# Patient Record
Sex: Male | Born: 1990 | Race: White | Hispanic: No | Marital: Married | Smoking: Never smoker
Health system: Southern US, Community
[De-identification: ages and names within clinical notes are randomized; demographics above are authoritative.]

## PROBLEM LIST (undated history)

## (undated) DIAGNOSIS — J45909 Unspecified asthma, uncomplicated: Secondary | ICD-10-CM

## (undated) HISTORY — PX: WISDOM TOOTH EXTRACTION: SHX21

## (undated) HISTORY — DX: Unspecified asthma, uncomplicated: J45.909

---
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
## 2017-05-19 ENCOUNTER — Encounter (HOSPITAL_COMMUNITY): Payer: Self-pay | Admitting: Cardiology

## 2017-05-19 ENCOUNTER — Emergency Department (HOSPITAL_COMMUNITY)
Admission: EM | Admit: 2017-05-19 | Discharge: 2017-05-19 | Disposition: A | Discharge status: Home / Self Care | Payer: 59 | Attending: Emergency Medicine | Admitting: Emergency Medicine

## 2017-05-19 DIAGNOSIS — L237 Allergic contact dermatitis due to plants, except food: Secondary | ICD-10-CM | POA: Insufficient documentation

## 2017-05-19 DIAGNOSIS — R21 Rash and other nonspecific skin eruption: Secondary | ICD-10-CM | POA: Diagnosis present

## 2017-05-19 MED ORDER — DEXAMETHASONE SODIUM PHOSPHATE 10 MG/ML IJ SOLN
10.0000 mg | Freq: Once | INTRAMUSCULAR | Status: AC
  Administered 2017-05-19: 10 mg via INTRAMUSCULAR
  Filled 2017-05-19: qty 1

## 2017-05-19 MED ORDER — PREDNISONE 20 MG PO TABS: 40.0000 mg | ORAL_TABLET | Freq: Every day | ORAL | 0 refills | Status: DC

## 2017-05-19 NOTE — Discharge Instructions (Signed)
Please wash hands frequently.  Please use a glove to prevent spread of the infection to your own body as well as to others.  Please use prednisone 2 tablets daily with a meal until all taken.

## 2017-05-19 NOTE — ED Triage Notes (Signed)
Rash to right hand left upper arm for 2 days.
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
## 2020-12-10 ENCOUNTER — Other Ambulatory Visit: Payer: Self-pay

## 2020-12-10 ENCOUNTER — Emergency Department (HOSPITAL_BASED_OUTPATIENT_CLINIC_OR_DEPARTMENT_OTHER)
Admission: EM | Admit: 2020-12-10 | Discharge: 2020-12-11 | Disposition: A | Discharge status: Home / Self Care | Payer: Worker's Compensation | Attending: Emergency Medicine | Admitting: Emergency Medicine

## 2020-12-10 ENCOUNTER — Encounter (HOSPITAL_BASED_OUTPATIENT_CLINIC_OR_DEPARTMENT_OTHER): Payer: Self-pay | Admitting: *Deleted

## 2020-12-10 DIAGNOSIS — R238 Other skin changes: Secondary | ICD-10-CM

## 2020-12-10 DIAGNOSIS — Z77098 Contact with and (suspected) exposure to other hazardous, chiefly nonmedicinal, chemicals: Secondary | ICD-10-CM

## 2020-12-10 DIAGNOSIS — R21 Rash and other nonspecific skin eruption: Secondary | ICD-10-CM | POA: Insufficient documentation

## 2020-12-10 DIAGNOSIS — T603X1A Toxic effect of herbicides and fungicides, accidental (unintentional), initial encounter: Secondary | ICD-10-CM | POA: Insufficient documentation

## 2020-12-10 MED ORDER — LORATADINE 10 MG PO TABS
10.0000 mg | ORAL_TABLET | Freq: Once | ORAL | Status: AC
  Administered 2020-12-11: 10 mg via ORAL
  Filled 2020-12-10: qty 1

## 2020-12-10 MED ORDER — CETIRIZINE HCL 10 MG PO TABS: 10.0000 mg | ORAL_TABLET | Freq: Every day | ORAL | 0 refills | Status: DC
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
## 2022-02-23 ENCOUNTER — Ambulatory Visit
Admission: RE | Admit: 2022-02-23 | Discharge: 2022-02-23 | Disposition: A | Discharge status: Home / Self Care | Payer: 59 | Source: Ambulatory Visit | Attending: Family Medicine | Admitting: Family Medicine

## 2022-02-23 VITALS — BP 134/85 | HR 66 | Temp 98.2°F | Resp 18

## 2022-02-23 DIAGNOSIS — B084 Enteroviral vesicular stomatitis with exanthem: Secondary | ICD-10-CM

## 2022-02-23 NOTE — ED Triage Notes (Signed)
Rash on hands and bumps on throat that started this morning.  States he feel foggy headed.  Daughter has hand foot and mouth.

## 2022-02-23 NOTE — ED Provider Notes (Signed)
  [facility] CARE [facility]ENTER   [id number] 02/23/22 Arrival Time: 0850  ASSESSMENT & PLAN:  1. Hand, foot and mouth disease    OTC symptom care. Work note provided.   Follow-up Information     [facility] at [facility].   Specialty: Urgent Care Why: As needed. Contact information: [address] [place] [address] [phone]               Will follow up with PCP or here if worsening or failing to improve as anticipated. Reviewed expectations re: course of current medical issues. Questions answered. Outlined signs and symptoms indicating need for more acute intervention. Patient verbalized understanding. After Visit Summary given.   SUBJECTIVE:  Samuel Kramer is a 31 y.o. male who presents with a skin complaint. Child with hand/foot/mouth. He awoke 48 h ago with fever and body aches. Now with rash on hand and feet; with areas in mouth also. OTC with mild relief. Feels fatigued.  OBJECTIVE: Vitals:   02/23/22 0856  BP: 134/85  Pulse: 66  Resp: 18  Temp: 98.2 F (36.8 C)  TempSrc: Oral  SpO2: 98%    General appearance: alert; no distress HEENT: [place]; AT; small blisters in mouth Neck: supple with FROM Lungs: clear to auscultation bilaterally Heart: regular rate and rhythm Extremities: no edema; moves all extremities normally Skin: warm and dry; signs of infection: no; oval flat pink macules on hands Psychological: alert and cooperative; normal mood and affect  Allergies  Allergen Reactions   Amoxicillin Anaphylaxis   Codeine Anaphylaxis    History reviewed. No pertinent past medical history. Social History   Socioeconomic History   Marital status: Single    Spouse name: Not on file   Number of children: Not on file   Years of education: Not on file   Highest education level: Not on file  Occupational History   Not on file  Tobacco Use   Smoking status: Never   Smokeless tobacco: Never  Substance and  Sexual Activity   Alcohol use: Yes    Comment: occasional    Drug use: No   Sexual activity: Not on file  Other Topics Concern   Not on file  Social History Narrative   Not on file   Social Determinants of Health   Financial Resource Strain: Not on file  Food Insecurity: Not on file  Transportation Needs: Not on file  Physical Activity: Not on file  Stress: Not on file  Social Connections: Not on file  Intimate Partner Violence: Not on file   History reviewed. No pertinent family history. Past Surgical History:  Procedure Laterality Date   WISDOM TOOTH EXTRACTION        Mardella Layman, MD 02/23/22 [id number]

## 2023-11-09 ENCOUNTER — Encounter: Payer: Self-pay | Admitting: Emergency Medicine

## 2023-11-09 ENCOUNTER — Ambulatory Visit
Admission: EM | Admit: 2023-11-09 | Discharge: 2023-11-09 | Disposition: A | Discharge status: Home / Self Care | Attending: Nurse Practitioner | Admitting: Nurse Practitioner

## 2023-11-09 DIAGNOSIS — B9689 Other specified bacterial agents as the cause of diseases classified elsewhere: Secondary | ICD-10-CM

## 2023-11-09 DIAGNOSIS — J019 Acute sinusitis, unspecified: Secondary | ICD-10-CM

## 2023-11-09 MED ORDER — BENZONATATE 100 MG PO CAPS: 100.0000 mg | ORAL_CAPSULE | Freq: Three times a day (TID) | ORAL | 0 refills | Status: DC | PRN

## 2023-11-09 MED ORDER — PROMETHAZINE-DM 6.25-15 MG/5ML PO SYRP: 5.0000 mL | ORAL_SOLUTION | Freq: Every evening | ORAL | 0 refills | Status: DC | PRN

## 2023-11-09 MED ORDER — DOXYCYCLINE HYCLATE 100 MG PO CAPS: 100.0000 mg | ORAL_CAPSULE | Freq: Two times a day (BID) | ORAL | 0 refills | Status: AC

## 2023-11-09 NOTE — ED Provider Notes (Addendum)
 [facility]    CSN: [id number] Arrival date & time: 11/09/23  0845      History   Chief Complaint No chief complaint on file.   HPI Samuel Kramer is a 33 y.o. male.   Patient presents today for 2 week history of congested cough, sinus headache, sinus pressure, chest pain with cough, sore throat from coughing, decreased appetite, and fatigue.  He reports send symptoms first began, he also had fever, body aches, and chills but this has not resolved.  He denies abdominal pain, nausea/vomiting, diarrhea.  He does have a little bit of ear pressure bilaterally.  Has taken Mucinex, DayQuil, NyQuil, and takes Zyrtec  every day for allergies which have not helped fully resolve symptoms.  No known sick contacts.    History reviewed. No pertinent past medical history.  There are no active problems to display for this patient.   Past Surgical History:  Procedure Laterality Date   WISDOM TOOTH EXTRACTION         Home Medications    Prior to Admission medications   Medication Sig Start Date End Date Taking? Authorizing Provider  benzonatate  (TESSALON ) 100 MG capsule Take 1 capsule (100 mg total) by mouth 3 (three) times daily as needed for cough. Do not take with alcohol or while operating or driving heavy machinery 12/10/93  Yes Wilhemena Harbour, NP  doxycycline  (VIBRAMYCIN ) 100 MG capsule Take 1 capsule (100 mg total) by mouth 2 (two) times daily for 7 days. 11/09/23 11/16/23 Yes Wilhemena Harbour, NP  promethazine -dextromethorphan (PROMETHAZINE -DM) 6.25-15 MG/5ML syrup Take 5 mLs by mouth at bedtime as needed for cough. Do not take with alcohol or while driving or operating heavy machinery.  May cause drowsiness. 11/09/23  Yes Wilhemena Harbour, NP    Family History History reviewed. No pertinent family history.  Social History Social History   Tobacco Use   Smoking status: Never   Smokeless tobacco: Never  Substance Use Topics   Alcohol use: Yes    Comment:  occasional    Drug use: No     Allergies   Amoxicillin and Codeine   Review of Systems Review of Systems Per HPI  Physical Exam Triage Vital Signs ED Triage Vitals  Encounter Vitals Group     BP 11/09/23 0921 127/87     Systolic BP Percentile --      Diastolic BP Percentile --      Pulse Rate 11/09/23 0921 76     Resp 11/09/23 0921 18     Temp 11/09/23 0921 98.4 F (36.9 C)     Temp Source 11/09/23 0921 Oral     SpO2 11/09/23 0921 96 %     Weight --      Height --      Head Circumference --      Peak Flow --      Pain Score 11/09/23 0922 6     Pain Loc --      Pain Education --      Exclude from Growth Chart --    No data found.  Updated Vital Signs BP 127/87 (BP Location: Right Arm)   Pulse 76   Temp 98.4 F (36.9 C) (Oral)   Resp 18   SpO2 96%   Visual Acuity Right Eye Distance:   Left Eye Distance:   Bilateral Distance:    Right Eye Near:   Left Eye Near:    Bilateral Near:     Physical Exam Vitals and  nursing note reviewed.  Constitutional:      General: He is not in acute distress.    Appearance: Normal appearance. He is not ill-appearing or toxic-appearing.  HENT:     Head: Normocephalic and atraumatic.     Right Ear: Tympanic membrane, ear canal and external ear normal.     Left Ear: Tympanic membrane, ear canal and external ear normal.     Nose: Congestion present. No rhinorrhea.     Right Sinus: Maxillary sinus tenderness and frontal sinus tenderness present.     Left Sinus: Maxillary sinus tenderness and frontal sinus tenderness present.     Mouth/Throat:     Mouth: Mucous membranes are moist.     Pharynx: Oropharynx is clear. No oropharyngeal exudate or posterior oropharyngeal erythema.  Eyes:     General: No scleral icterus.    Extraocular Movements: Extraocular movements intact.  Cardiovascular:     Rate and Rhythm: Normal rate and regular rhythm.  Pulmonary:     Effort: Pulmonary effort is normal. No respiratory distress.      Breath sounds: Normal breath sounds. No wheezing, rhonchi or rales.  Musculoskeletal:     Cervical back: Normal range of motion and neck supple.  Lymphadenopathy:     Cervical: No cervical adenopathy.  Skin:    General: Skin is warm and dry.     Coloration: Skin is not jaundiced or pale.     Findings: No erythema or rash.  Neurological:     Mental Status: He is alert and oriented to person, place, and time.  Psychiatric:        Behavior: Behavior is cooperative.      UC Treatments / Results  Labs (all labs ordered are listed, but only abnormal results are displayed) Labs Reviewed - No data to display  EKG   Radiology No results found.  Procedures Procedures (including critical care time)  Medications Ordered in UC Medications - No data to display  Initial Impression / Assessment and Plan / UC Course  I have reviewed the triage vital signs and the nursing notes.  Pertinent labs & imaging results that were available during my care of the patient were reviewed by me and considered in my medical decision making (see chart for details).   Patient is well-appearing, normotensive, afebrile, not tachycardic, not tachypneic, oxygenating well on room air.    1. Acute bacterial sinusitis Treat with doxycycline  twice daily for 7 days Supportive care discussed with patient including continue guaifenesin, increase hydration, cough suppressant medication as needed Return and ER precautions discussed  The patient was given the opportunity to ask questions.  All questions answered to their satisfaction.  The patient is in agreement to this plan.   Final Clinical Impressions(s) / UC Diagnoses   Final diagnoses:  Acute bacterial sinusitis     Discharge Instructions      You have a sinus infection.  Take the doxycycline  as prescribed to treat it.  Symptoms should improve over the next few days.  If you develop chest pain or shortness of breath, go to the emergency room.  Some  things that can make you feel better are: - Increased rest - Increasing fluid with water/sugar free electrolytes - Acetaminophen and ibuprofen as needed for fever/pain - Salt water gargling, chloraseptic spray and throat lozenges - OTC guaifenesin (Mucinex) 600 mg twice daily for congestion - Saline sinus flushes or a neti pot - Humidifying the air -Tessalon  Perles every 8 hours as needed for dry cough  and cough syrup at night time as needed     ED Prescriptions     Medication Sig Dispense Auth. Provider   doxycycline  (VIBRAMYCIN ) 100 MG capsule Take 1 capsule (100 mg total) by mouth 2 (two) times daily for 7 days. 14 capsule Thena Fireman A, NP   benzonatate  (TESSALON ) 100 MG capsule Take 1 capsule (100 mg total) by mouth 3 (three) times daily as needed for cough. Do not take with alcohol or while operating or driving heavy machinery 21 capsule Thena Fireman A, NP   promethazine -dextromethorphan (PROMETHAZINE -DM) 6.25-15 MG/5ML syrup Take 5 mLs by mouth at bedtime as needed for cough. Do not take with alcohol or while driving or operating heavy machinery.  May cause drowsiness. 118 mL Wilhemena Harbour, NP      PDMP not reviewed this encounter.   Wilhemena Harbour, NP 11/09/23 1033    Wilhemena Harbour, NP 11/09/23 [phone]

## 2023-11-09 NOTE — ED Triage Notes (Signed)
 Nasal congestion x 2 weeks.  Has been taking zyrtec , mucinex and dayquila nd nyquil without relief.  States he has a productive cough since yesterday with pressure in chest.

## 2023-11-09 NOTE — Discharge Instructions (Signed)
 You have a sinus infection.  Take the doxycycline  as prescribed to treat it.  Symptoms should improve over the next few days.  If you develop chest pain or shortness of breath, go to the emergency room.  Some things that can make you feel better are: - Increased rest - Increasing fluid with water/sugar free electrolytes - Acetaminophen and ibuprofen as needed for fever/pain - Salt water gargling, chloraseptic spray and throat lozenges - OTC guaifenesin (Mucinex) 600 mg twice daily for congestion - Saline sinus flushes or a neti pot - Humidifying the air -Tessalon  Perles every 8 hours as needed for dry cough and cough syrup at night time as needed

## 2024-03-12 ENCOUNTER — Ambulatory Visit: Admitting: Family Medicine

## 2024-04-02 ENCOUNTER — Ambulatory Visit: Admitting: Family Medicine

## 2024-04-02 ENCOUNTER — Encounter: Payer: Self-pay | Admitting: Family Medicine

## 2024-04-02 VITALS — BP 115/79 | HR 109 | Temp 97.9°F | Ht 69.0 in | Wt 165.8 lb

## 2024-04-02 DIAGNOSIS — R5383 Other fatigue: Secondary | ICD-10-CM

## 2024-04-02 DIAGNOSIS — Z0001 Encounter for general adult medical examination with abnormal findings: Secondary | ICD-10-CM | POA: Diagnosis not present

## 2024-04-02 DIAGNOSIS — Z8679 Personal history of other diseases of the circulatory system: Secondary | ICD-10-CM

## 2024-04-02 DIAGNOSIS — Z0000 Encounter for general adult medical examination without abnormal findings: Secondary | ICD-10-CM | POA: Insufficient documentation

## 2024-04-02 DIAGNOSIS — J0110 Acute frontal sinusitis, unspecified: Secondary | ICD-10-CM

## 2024-04-02 DIAGNOSIS — R0683 Snoring: Secondary | ICD-10-CM | POA: Diagnosis not present

## 2024-04-02 NOTE — Assessment & Plan Note (Signed)

## 2024-04-02 NOTE — Progress Notes (Signed)
 New Patient Office Visit  Subjective    Patient ID: Samuel Kramer, male    DOB: [birth year]  Age: 33 y.o. MRN: [id number]  CC:  Chief Complaint  Patient presents with   Establish Care    Chronic knee problems.   Presenting today w/ Sinus issues, dry cough w/ yellow/ brownish mucus production, runny nose, headache since Thursday of last week.     HPI Samuel Kramer presents to establish care. Oriented to practice routines and expectations. Has not been seeing a PCP in 4 years. PMH includes asthma, right knee meniscal tear (followed by Ortho).   Other concerns include mulopurulent sinus drainage, cough, malaise since Thursday Denies fever, chills, body aches, SOB, wheezing, pleurisy, N/V/D His children are also sick, no home flu/covid testing Is overall improving Has tried sinusex OTC  Fatigue: chronic, prior workup negative and included cardiology workup, TSH, testosterone that he can recall, insurance previously denied sleep study and he would like to pursue this, STOP BANG 3  Tobacco: non-smoker Vaccines: declined  Health Maintenance  Topic Date Due   Hepatitis C Screening  Never done   DTaP/Tdap/Td (1 - Tdap) Never done   Hepatitis B Vaccines 19-59 Average Risk (1 of 3 - 19+ 3-dose series) Never done   HPV VACCINES (1 - 3-dose SCDM series) Never done   COVID-19 Vaccine (1 - 2024-25 season) 04/18/2024 (Originally 03/11/2024)   Influenza Vaccine  04/24/2024 (Originally 02/09/2024)   HIV Screening  Completed   Pneumococcal Vaccine  Aged Out   Meningococcal B Vaccine  Aged Out     Outpatient Encounter Medications as of 04/02/2024  Medication Sig   Creatine Monohydrate (GNP CREATINE MONOHYDRATE) 1 g CHEW Chew 4 Pieces of gum by mouth daily.   Multiple Vitamin (MULTIVITAMIN WITH MINERALS) TABS tablet Take 2 tablets by mouth daily.   [DISCONTINUED] meloxicam (MOBIC) 15 MG tablet Take 15 mg by mouth daily.   [DISCONTINUED] benzonatate  (TESSALON ) 100 MG capsule Take 1 capsule  (100 mg total) by mouth 3 (three) times daily as needed for cough. Do not take with alcohol or while operating or driving heavy machinery   [DISCONTINUED] promethazine -dextromethorphan (PROMETHAZINE -DM) 6.25-15 MG/5ML syrup Take 5 mLs by mouth at bedtime as needed for cough. Do not take with alcohol or while driving or operating heavy machinery.  May cause drowsiness.   No facility-administered encounter medications on file as of 04/02/2024.    Past Medical History:  Diagnosis Date   Asthma     Past Surgical History:  Procedure Laterality Date   WISDOM TOOTH EXTRACTION      History reviewed. No pertinent family history.  Social History   Socioeconomic History   Marital status: Married    Spouse name: Not on file   Number of children: 2   Years of education: Not on file   Highest education level: Some college, no degree  Occupational History   Not on file  Tobacco Use   Smoking status: Never    Passive exposure: Never   Smokeless tobacco: Never  Vaping Use   Vaping status: Never Used  Substance and Sexual Activity   Alcohol use: Yes    Alcohol/week: 3.0 standard drinks of alcohol    Types: 2 Cans of beer, 1 Shots of liquor per week    Comment: occasional    Drug use: No   Sexual activity: Yes    Birth control/protection: None  Other Topics Concern   Not on file  Social History Narrative  Not on file   Social Drivers of Health   Financial Resource Strain: Low Risk  (04/01/2024)   Overall Financial Resource Strain (CARDIA)    Difficulty of Paying Living Expenses: Not hard at all  Food Insecurity: No Food Insecurity (04/01/2024)   Hunger Vital Sign    Worried About Running Out of Food in the Last Year: Never true    Ran Out of Food in the Last Year: Never true  Transportation Needs: No Transportation Needs (04/01/2024)   PRAPARE - Administrator, Civil Service (Medical): No    Lack of Transportation (Non-Medical): No  Physical Activity: Sufficiently  Active (04/01/2024)   Exercise Vital Sign    Days of Exercise per Week: 5 days    Minutes of Exercise per Session: 150+ min  Stress: Stress Concern Present (04/01/2024)   [employer] of Occupational Health - Occupational Stress Questionnaire    Feeling of Stress: To some extent  Social Connections: Moderately Isolated (04/01/2024)   Social Connection and Isolation Panel    Frequency of Communication with Friends and Family: More than three times a week    Frequency of Social Gatherings with Friends and Family: Twice a week    Attends Religious Services: Patient declined    Database administrator or Organizations: No    Attends Engineer, structural: Not on file    Marital Status: Married  Intimate Partner Violence: Unknown (10/14/2021)   Received from [facility]   HITS    Physically Hurt: Not on file    Insult or Talk Down To: Not on file    Threaten Physical Harm: Not on file    Scream or Curse: Not on file    Review of Systems  Constitutional:  Positive for malaise/fatigue.  HENT:  Positive for congestion and sinus pain.   Eyes: Negative.   Respiratory: Negative.    Cardiovascular: Negative.   Gastrointestinal: Negative.   Genitourinary: Negative.   Musculoskeletal: Negative.   Skin: Negative.   Neurological: Negative.   Endo/Heme/Allergies: Negative.   Psychiatric/Behavioral: Negative.    All other systems reviewed and are negative.       Objective    BP 115/79   Pulse (!) 109   Temp 97.9 F (36.6 C)   Ht 5' 9 (1.753 m)   Wt 165 lb 12.8 oz (75.2 kg)   SpO2 98%   BMI 24.48 kg/m   Physical Exam Vitals and nursing note reviewed.  Constitutional:      Appearance: Normal appearance. He is normal weight.  HENT:     Head: Normocephalic and atraumatic.     Right Ear: Tympanic membrane, ear canal and external ear normal.     Left Ear: Tympanic membrane, ear canal and external ear normal.     Nose: Congestion present.     Mouth/Throat:     Mouth:  Mucous membranes are moist.     Pharynx: Oropharynx is clear.  Eyes:     Extraocular Movements: Extraocular movements intact.     Right eye: Normal extraocular motion and no nystagmus.     Left eye: Normal extraocular motion and no nystagmus.     Conjunctiva/sclera: Conjunctivae normal.     Pupils: Pupils are equal, round, and reactive to light.  Cardiovascular:     Rate and Rhythm: Normal rate and regular rhythm.     Pulses: Normal pulses.     Heart sounds: Normal heart sounds.  Pulmonary:     Effort: Pulmonary effort is  normal.     Breath sounds: Normal breath sounds.  Abdominal:     General: Bowel sounds are normal.     Palpations: Abdomen is soft.  Genitourinary:    Comments: Deferred using shared decision making Musculoskeletal:        General: Normal range of motion.     Cervical back: Normal range of motion and neck supple.  Skin:    General: Skin is warm and dry.     Capillary Refill: Capillary refill takes less than 2 seconds.  Neurological:     General: No focal deficit present.     Mental Status: He is alert. Mental status is at baseline.  Psychiatric:        Mood and Affect: Mood normal.        Speech: Speech normal.        Behavior: Behavior normal.        Thought Content: Thought content normal.        Cognition and Memory: Cognition and memory normal.        Judgment: Judgment normal.         Assessment & Plan:   Problem List Items Addressed This Visit     Physical exam, annual - Primary   Today your medical history was reviewed and routine physical exam with labs was performed. Recommend 150 minutes of moderate intensity exercise weekly and consuming a well-balanced diet. Advised to stop smoking if a smoker, avoid smoking if a non-smoker, limit alcohol consumption to 1 drink per day for women and 2 drinks per day for men, and avoid illicit drug use. Counseled on safe sex practices and offered STI testing today. Counseled on the importance of sunscreen  use. Counseled in mental health awareness and when to seek medical care. Vaccine maintenance discussed. Appropriate health maintenance items reviewed. Return to office in 1 year for annual physical exam.       Relevant Orders   CBC with Differential/Platelet   Comprehensive metabolic panel with GFR   Lipid panel   Hepatitis C antibody   VITAMIN D  25 Hydroxy (Vit-D Deficiency, Fractures)   TSH   Snoring   Stop bang 3, referral to sleep studies      Relevant Orders   Ambulatory referral to Sleep Studies   Fatigue   Chronic. TSH, CBC, vitamin D  today. EKG NSR. Referral placed for sleep studies, stop bang 3.      Relevant Orders   CBC with Differential/Platelet   VITAMIN D  25 Hydroxy (Vit-D Deficiency, Fractures)   TSH   Ambulatory referral to Sleep Studies   EKG 12-Lead   History of irregular heartbeat   Normal EKG.      Relevant Orders   EKG 12-Lead   Acute non-recurrent frontal sinusitis   Symptoms overall improving, continue to monitor and will consider Augmentin if persists or worsens. Continue flonase, mucinex, saline flushes. Follow up PRN       Return in about 1 year (around 04/02/2025) for annual physical with labs 1 week prior.   Samuel GORMAN Barrio, FNP

## 2024-04-02 NOTE — Assessment & Plan Note (Signed)
 Stop bang 3, referral to sleep studies

## 2024-04-02 NOTE — Patient Instructions (Signed)
 It was great to meet you today and I'm excited to have you join the [facility] practice. I hope you had a positive experience today! If you feel so inclined, please feel free to recommend our practice to friends and family. Jeoffrey Barrio, FNP-C

## 2024-04-02 NOTE — Assessment & Plan Note (Signed)
Normal EKG

## 2024-04-02 NOTE — Assessment & Plan Note (Signed)
 Symptoms overall improving, continue to monitor and will consider Augmentin if persists or worsens. Continue flonase, mucinex, saline flushes. Follow up PRN

## 2024-04-02 NOTE — Assessment & Plan Note (Signed)
 Chronic. TSH, CBC, vitamin D  today. EKG NSR. Referral placed for sleep studies, stop bang 3.

## 2024-04-03 ENCOUNTER — Ambulatory Visit: Payer: Self-pay | Admitting: Family Medicine

## 2024-04-03 DIAGNOSIS — R7989 Other specified abnormal findings of blood chemistry: Secondary | ICD-10-CM

## 2024-04-03 DIAGNOSIS — E789 Disorder of lipoprotein metabolism, unspecified: Secondary | ICD-10-CM

## 2024-04-03 LAB — COMPREHENSIVE METABOLIC PANEL WITH GFR
AG Ratio: 1.6 (calc) (ref 1.0–2.5)
ALT: 34 U/L (ref 9–46)
AST: 20 U/L (ref 10–40)
Albumin: 4.5 g/dL (ref 3.6–5.1)
Alkaline phosphatase (APISO): 78 U/L (ref 36–130)
BUN: 11 mg/dL (ref 7–25)
CO2: 26 mmol/L (ref 20–32)
Calcium: 9.5 mg/dL (ref 8.6–10.3)
Chloride: 103 mmol/L (ref 98–110)
Creat: 1 mg/dL (ref 0.60–1.26)
Globulin: 2.8 g/dL (ref 1.9–3.7)
Glucose, Bld: 81 mg/dL (ref 65–99)
Potassium: 4.6 mmol/L (ref 3.5–5.3)
Sodium: 140 mmol/L (ref 135–146)
Total Bilirubin: 0.4 mg/dL (ref 0.2–1.2)
Total Protein: 7.3 g/dL (ref 6.1–8.1)
eGFR: 104 mL/min/1.73m2 (ref >=60)

## 2024-04-03 LAB — CBC WITH DIFFERENTIAL/PLATELET
Absolute Lymphocytes: 1705 {cells}/uL (ref 850–3900)
Absolute Monocytes: 563 {cells}/uL (ref 200–950)
Basophils Absolute: 59 {cells}/uL (ref 0–200)
Basophils Relative: 0.7 %
Eosinophils Absolute: 193 {cells}/uL (ref 15–500)
Eosinophils Relative: 2.3 %
HCT: 50.9 % — ABNORMAL HIGH (ref 38.5–50.0)
Hemoglobin: 16.1 g/dL (ref 13.2–17.1)
MCH: 26.4 pg — ABNORMAL LOW (ref 27.0–33.0)
MCHC: 31.6 g/dL — ABNORMAL LOW (ref 32.0–36.0)
MCV: 83.4 fL (ref 80.0–100.0)
MPV: 11.1 fL (ref 7.5–12.5)
Monocytes Relative: 6.7 %
Neutro Abs: 5880 {cells}/uL (ref 1500–7800)
Neutrophils Relative %: 70 %
Platelets: 268 Thousand/uL (ref 140–400)
RBC: 6.1 Million/uL — ABNORMAL HIGH (ref 4.20–5.80)
RDW: 14.5 % (ref 11.0–15.0)
Total Lymphocyte: 20.3 %
WBC: 8.4 Thousand/uL (ref 3.8–10.8)

## 2024-04-03 LAB — LIPID PANEL
Cholesterol: 195 mg/dL (ref <200)
HDL: 44 mg/dL (ref >=40)
LDL Cholesterol (Calc): 110 mg/dL — ABNORMAL HIGH
Non-HDL Cholesterol (Calc): 151 mg/dL — ABNORMAL HIGH (ref <130)
Total CHOL/HDL Ratio: 4.4 (calc) (ref <5.0)
Triglycerides: 311 mg/dL — ABNORMAL HIGH (ref <150)

## 2024-04-03 LAB — TSH: TSH: 0.95 m[IU]/L (ref 0.40–4.50)

## 2024-04-03 LAB — VITAMIN D 25 HYDROXY (VIT D DEFICIENCY, FRACTURES): Vit D, 25-Hydroxy: 27 ng/mL — ABNORMAL LOW (ref 30–100)

## 2024-04-03 LAB — HEPATITIS C ANTIBODY: Hepatitis C Ab: NONREACTIVE

## 2024-06-04 ENCOUNTER — Ambulatory Visit
Admission: EM | Admit: 2024-06-04 | Discharge: 2024-06-04 | Disposition: A | Discharge status: Home / Self Care | Attending: Family Medicine | Admitting: Family Medicine

## 2024-06-04 DIAGNOSIS — Z8709 Personal history of other diseases of the respiratory system: Secondary | ICD-10-CM | POA: Diagnosis not present

## 2024-06-04 DIAGNOSIS — J069 Acute upper respiratory infection, unspecified: Secondary | ICD-10-CM | POA: Diagnosis not present

## 2024-06-04 MED ORDER — ALBUTEROL SULFATE HFA 108 (90 BASE) MCG/ACT IN AERS: 2.0000 | INHALATION_SPRAY | RESPIRATORY_TRACT | 0 refills | Status: AC | PRN

## 2024-06-04 MED ORDER — AZELASTINE HCL 0.1 % NA SOLN: 1.0000 | Freq: Two times a day (BID) | NASAL | 0 refills | Status: AC

## 2024-06-04 MED ORDER — PROMETHAZINE-DM 6.25-15 MG/5ML PO SYRP: 5.0000 mL | ORAL_SOLUTION | Freq: Four times a day (QID) | ORAL | 0 refills | Status: AC | PRN

## 2024-06-04 NOTE — ED Provider Notes (Signed)
 [facility]    CSN: [id number] Arrival date & time: 06/04/24  9161      History   Chief Complaint No chief complaint on file.   HPI Samuel Kramer is a 33 y.o. male.   Patient presenting today with 5-day history of congestion, cough, sore throat, chest tightness.  Denies fever, chills, chest pain, shortness of breath, abdominal pain, vomiting, diarrhea.  So far trying DayQuil and NyQuil with only mild relief.  History of asthma but states has not needed treatment for this in years.    Past Medical History:  Diagnosis Date   Asthma     Patient Active Problem List   Diagnosis Date Noted   Physical exam, annual 04/02/2024   Snoring 04/02/2024   Fatigue 04/02/2024   History of irregular heartbeat 04/02/2024   Acute non-recurrent frontal sinusitis 04/02/2024    Past Surgical History:  Procedure Laterality Date   WISDOM TOOTH EXTRACTION         Home Medications    Prior to Admission medications   Medication Sig Start Date End Date Taking? Authorizing Provider  albuterol  (VENTOLIN  HFA) 108 (90 Base) MCG/ACT inhaler Inhale 2 puffs into the lungs every 4 (four) hours as needed. 06/04/24  Yes Stuart Vernell Norris, PA-C  azelastine  (ASTELIN ) 0.1 % nasal spray Place 1 spray into both nostrils 2 (two) times daily. Use in each nostril as directed 06/04/24  Yes Stuart Vernell Norris, PA-C  promethazine -dextromethorphan (PROMETHAZINE -DM) 6.25-15 MG/5ML syrup Take 5 mLs by mouth 4 (four) times daily as needed. 06/04/24  Yes Stuart Vernell Norris, PA-C  Creatine Monohydrate (GNP CREATINE MONOHYDRATE) 1 g CHEW Chew 4 Pieces of gum by mouth daily.    [provider]  Multiple Vitamin (MULTIVITAMIN WITH MINERALS) TABS tablet Take 2 tablets by mouth daily.    [provider]    Family History History reviewed. No pertinent family history.  Social History Social History   Tobacco Use   Smoking status: Never    Passive exposure: Never    Smokeless tobacco: Never  Vaping Use   Vaping status: Never Used  Substance Use Topics   Alcohol use: Yes    Alcohol/week: 3.0 standard drinks of alcohol    Types: 2 Cans of beer, 1 Shots of liquor per week    Comment: occasional    Drug use: No     Allergies   Amoxicillin and Codeine   Review of Systems Review of Systems Per HPI  Physical Exam Triage Vital Signs ED Triage Vitals  Encounter Vitals Group     BP 06/04/24 0847 124/81     Girls Systolic BP Percentile --      Girls Diastolic BP Percentile --      Boys Systolic BP Percentile --      Boys Diastolic BP Percentile --      Pulse Rate 06/04/24 0847 70     Resp 06/04/24 0847 18     Temp 06/04/24 0847 98 F (36.7 C)     Temp Source 06/04/24 0847 Oral     SpO2 06/04/24 0847 97 %     Weight --      Height --      Head Circumference --      Peak Flow --      Pain Score 06/04/24 0848 0     Pain Loc --      Pain Education --      Exclude from Growth Chart --    No data  found.  Updated Vital Signs BP 124/81 (BP Location: Right Arm)   Pulse 70   Temp 98 F (36.7 C) (Oral)   Resp 18   SpO2 97%   Visual Acuity Right Eye Distance:   Left Eye Distance:   Bilateral Distance:    Right Eye Near:   Left Eye Near:    Bilateral Near:     Physical Exam Vitals and nursing note reviewed.  Constitutional:      Appearance: He is well-developed.  HENT:     Head: Atraumatic.     Right Ear: External ear normal.     Left Ear: External ear normal.     Nose: Rhinorrhea present.     Mouth/Throat:     Pharynx: Posterior oropharyngeal erythema present. No oropharyngeal exudate.  Eyes:     Conjunctiva/sclera: Conjunctivae normal.     Pupils: Pupils are equal, round, and reactive to light.  Cardiovascular:     Rate and Rhythm: Normal rate and regular rhythm.  Pulmonary:     Effort: Pulmonary effort is normal. No respiratory distress.     Breath sounds: No wheezing or rales.  Musculoskeletal:        General:  Normal range of motion.     Cervical back: Normal range of motion and neck supple.  Lymphadenopathy:     Cervical: No cervical adenopathy.  Skin:    General: Skin is warm and dry.  Neurological:     Mental Status: He is alert and oriented to person, place, and time.  Psychiatric:        Behavior: Behavior normal.      UC Treatments / Results  Labs (all labs ordered are listed, but only abnormal results are displayed) Labs Reviewed - No data to display  EKG   Radiology No results found.  Procedures Procedures (including critical care time)  Medications Ordered in UC Medications - No data to display  Initial Impression / Assessment and Plan / UC Course  I have reviewed the triage vital signs and the nursing notes.  Pertinent labs & imaging results that were available during my care of the patient were reviewed by me and considered in my medical decision making (see chart for details).     Vitals and exam overall reassuring today, suspect viral respiratory infection possibly with mild asthma exacerbation.  Will treat with albuterol , and again DM, Astelin , supportive over-the-counter medications and home care.  Return for worsening or unresolving symptoms.  Final Clinical Impressions(s) / UC Diagnoses   Final diagnoses:  Viral URI with cough  History of asthma   Discharge Instructions   None    ED Prescriptions     Medication Sig Dispense Auth. Provider   promethazine -dextromethorphan (PROMETHAZINE -DM) 6.25-15 MG/5ML syrup Take 5 mLs by mouth 4 (four) times daily as needed. 100 mL Stuart Vernell Norris, PA-C   albuterol  (VENTOLIN  HFA) 108 (90 Base) MCG/ACT inhaler Inhale 2 puffs into the lungs every 4 (four) hours as needed. 18 g Stuart Vernell Norris, PA-C   azelastine  (ASTELIN ) 0.1 % nasal spray Place 1 spray into both nostrils 2 (two) times daily. Use in each nostril as directed 30 mL Stuart Vernell Norris, PA-C      PDMP not reviewed this encounter.    Stuart Vernell Jaguas, [place] 06/04/24 [phone]

## 2024-06-04 NOTE — ED Triage Notes (Signed)
 Pt reports he has a cough, sore throat, drainage, body aches x 5 days

## 2024-07-09 ENCOUNTER — Other Ambulatory Visit

## 2024-07-09 DIAGNOSIS — E789 Disorder of lipoprotein metabolism, unspecified: Secondary | ICD-10-CM

## 2024-07-09 DIAGNOSIS — R7989 Other specified abnormal findings of blood chemistry: Secondary | ICD-10-CM

## 2024-07-10 LAB — CBC WITH DIFFERENTIAL/PLATELET
Absolute Lymphocytes: 2490 {cells}/uL (ref 850–3900)
Absolute Monocytes: 495 {cells}/uL (ref 200–950)
Basophils Absolute: 68 {cells}/uL (ref 0–200)
Basophils Relative: 0.9 %
Eosinophils Absolute: 143 {cells}/uL (ref 15–500)
Eosinophils Relative: 1.9 %
HCT: 46.6 % (ref 39.4–51.1)
Hemoglobin: 14.7 g/dL (ref 13.2–17.1)
MCH: 26.7 pg — ABNORMAL LOW (ref 27.0–33.0)
MCHC: 31.5 g/dL — ABNORMAL LOW (ref 31.6–35.4)
MCV: 84.6 fL (ref 81.4–101.7)
MPV: 11.3 fL (ref 7.5–12.5)
Monocytes Relative: 6.6 %
Neutro Abs: 4305 {cells}/uL (ref 1500–7800)
Neutrophils Relative %: 57.4 %
Platelets: 243 Thousand/uL (ref 140–400)
RBC: 5.51 Million/uL (ref 4.20–5.80)
RDW: 13.7 % (ref 11.0–15.0)
Total Lymphocyte: 33.2 %
WBC: 7.5 Thousand/uL (ref 3.8–10.8)

## 2024-07-10 LAB — LIPID PANEL
Cholesterol: 171 mg/dL
HDL: 45 mg/dL
LDL Cholesterol (Calc): 94 mg/dL
Non-HDL Cholesterol (Calc): 126 mg/dL
Total CHOL/HDL Ratio: 3.8 (calc)
Triglycerides: 220 mg/dL — ABNORMAL HIGH

## 2024-07-10 LAB — VITAMIN D 25 HYDROXY (VIT D DEFICIENCY, FRACTURES): Vit D, 25-Hydroxy: 30 ng/mL (ref 30–100)

## 2024-07-16 ENCOUNTER — Ambulatory Visit: Payer: Self-pay | Admitting: Family Medicine

## 2025-04-03 ENCOUNTER — Other Ambulatory Visit

## 2025-04-08 ENCOUNTER — Encounter: Admitting: Family Medicine
